# Patient Record
Sex: Male | Born: 1960 | Race: White | Hispanic: No | Marital: Married | Smoking: Current every day smoker
Health system: Southern US, Community
[De-identification: ages and names within clinical notes are randomized; demographics above are authoritative.]

## PROBLEM LIST (undated history)

## (undated) DIAGNOSIS — K7460 Unspecified cirrhosis of liver: Secondary | ICD-10-CM

## (undated) DIAGNOSIS — R251 Tremor, unspecified: Secondary | ICD-10-CM

## (undated) DIAGNOSIS — E119 Type 2 diabetes mellitus without complications: Secondary | ICD-10-CM

## (undated) DIAGNOSIS — M7551 Bursitis of right shoulder: Secondary | ICD-10-CM

## (undated) DIAGNOSIS — N179 Acute kidney failure, unspecified: Secondary | ICD-10-CM

## (undated) DIAGNOSIS — S069XAA Unspecified intracranial injury with loss of consciousness status unknown, initial encounter: Secondary | ICD-10-CM

## (undated) DIAGNOSIS — K7010 Alcoholic hepatitis without ascites: Secondary | ICD-10-CM

## (undated) DIAGNOSIS — S42001A Fracture of unspecified part of right clavicle, initial encounter for closed fracture: Secondary | ICD-10-CM

## (undated) DIAGNOSIS — L0390 Cellulitis, unspecified: Secondary | ICD-10-CM

## (undated) DIAGNOSIS — D696 Thrombocytopenia, unspecified: Secondary | ICD-10-CM

## (undated) DIAGNOSIS — E871 Hypo-osmolality and hyponatremia: Secondary | ICD-10-CM

## (undated) DIAGNOSIS — B1920 Unspecified viral hepatitis C without hepatic coma: Secondary | ICD-10-CM

## (undated) DIAGNOSIS — D638 Anemia in other chronic diseases classified elsewhere: Secondary | ICD-10-CM

## (undated) DIAGNOSIS — G9340 Encephalopathy, unspecified: Secondary | ICD-10-CM

## (undated) DIAGNOSIS — M5412 Radiculopathy, cervical region: Secondary | ICD-10-CM

## (undated) DIAGNOSIS — E876 Hypokalemia: Secondary | ICD-10-CM

## (undated) DIAGNOSIS — G40909 Epilepsy, unspecified, not intractable, without status epilepticus: Secondary | ICD-10-CM

## (undated) DIAGNOSIS — S069X9A Unspecified intracranial injury with loss of consciousness of unspecified duration, initial encounter: Secondary | ICD-10-CM

## (undated) HISTORY — PX: OTHER SURGICAL HISTORY: SHX169

## (undated) HISTORY — DX: Anemia in other chronic diseases classified elsewhere: D63.8

## (undated) HISTORY — DX: Acute kidney failure, unspecified: N17.9

---

## 2004-06-24 ENCOUNTER — Emergency Department (HOSPITAL_COMMUNITY): Admission: EM | Admit: 2004-06-24 | Discharge: 2004-06-24 | Payer: Self-pay | Admitting: Emergency Medicine

## 2005-09-16 ENCOUNTER — Emergency Department (HOSPITAL_COMMUNITY): Admission: EM | Admit: 2005-09-16 | Discharge: 2005-09-16 | Payer: Self-pay | Admitting: Emergency Medicine

## 2009-03-05 ENCOUNTER — Other Ambulatory Visit: Payer: Self-pay | Admitting: Internal Medicine

## 2009-07-01 ENCOUNTER — Emergency Department (HOSPITAL_COMMUNITY): Admission: EM | Admit: 2009-07-01 | Discharge: 2009-07-01 | Payer: Self-pay | Admitting: Emergency Medicine

## 2010-05-04 LAB — TYPE AND SCREEN: Antibody Screen: NEGATIVE

## 2010-05-04 LAB — COMPREHENSIVE METABOLIC PANEL
ALT: 34 U/L (ref 0–53)
AST: 92 U/L — ABNORMAL HIGH (ref 0–37)
CO2: 19 mEq/L (ref 19–32)
GFR calc Af Amer: >60 mL/min (ref >60)
Glucose, Bld: 96 mg/dL (ref 70–99)
Total Bilirubin: 1.7 mg/dL — ABNORMAL HIGH (ref 0.3–1.2)
Total Protein: 7 g/dL (ref 6.0–8.3)

## 2010-05-04 LAB — LIPASE, BLOOD: Lipase: 29 U/L (ref 11–59)

## 2010-05-04 LAB — PROTIME-INR: INR: 1.13 (ref 0.00–1.49)

## 2010-05-04 LAB — POCT I-STAT, CHEM 8
BUN: 4 mg/dL — ABNORMAL LOW (ref 6–23)
Chloride: 105 mEq/L (ref 96–112)
Glucose, Bld: 100 mg/dL — ABNORMAL HIGH (ref 70–99)
HCT: 44 % (ref 39.0–52.0)
Hemoglobin: 15 g/dL (ref 13.0–17.0)
TCO2: 20 mmol/L (ref 0–100)

## 2010-05-04 LAB — CBC
HCT: 42.6 % (ref 39.0–52.0)
Hemoglobin: 14.9 g/dL (ref 13.0–17.0)
MCV: 91.5 fL (ref 78.0–100.0)
Platelets: 89 10*3/uL — ABNORMAL LOW (ref 150–400)
RDW: 18.4 % — ABNORMAL HIGH (ref 11.5–15.5)

## 2010-05-04 LAB — RAPID URINE DRUG SCREEN, HOSP PERFORMED
Opiates: NOT DETECTED
Tetrahydrocannabinol: NOT DETECTED

## 2010-05-04 LAB — URINALYSIS, ROUTINE W REFLEX MICROSCOPIC
Bilirubin Urine: NEGATIVE
Glucose, UA: NEGATIVE mg/dL
Hgb urine dipstick: NEGATIVE
Ketones, ur: NEGATIVE mg/dL

## 2010-05-04 LAB — ABO/RH: ABO/RH(D): O NEG

## 2010-05-04 LAB — APTT: aPTT: 33 seconds (ref 24–37)

## 2010-11-26 IMAGING — CT CT CHEST W/ CM
1 series · 16 of 31 positions shown, 20 images · IV contrast (APPLIED)
Comparison: none

REASON FOR EXAM: hypoxia
COMMENTS:

PROCEDURE:     CT  - CT CHEST (FOR PE) W  - March 10, 2009  [DATE]
RESULT:     Chest CT dated 03/10/2009
TECHNIQUE: Helical 3 mm sections were obtained from the thoracic inlet
through the lung bases status post intravenous administration of 100 mL
Isovue 370.

[Series 4: soft tissue · axial · 0.74mm/px · z∈[-296,-38]mm · 16 of 94 slices shown, 20 images]
[im 4/94  mediastinal]
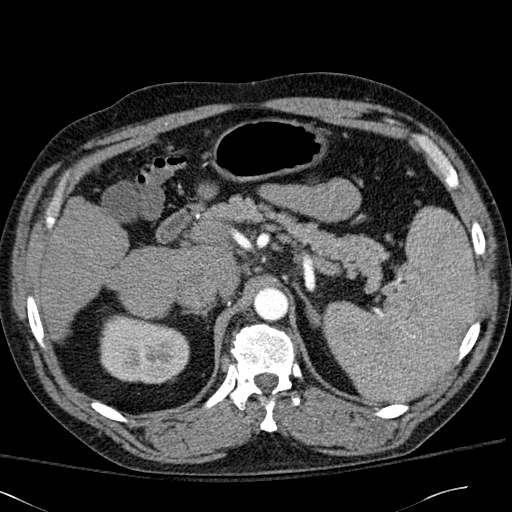
[im 4/94  lung]
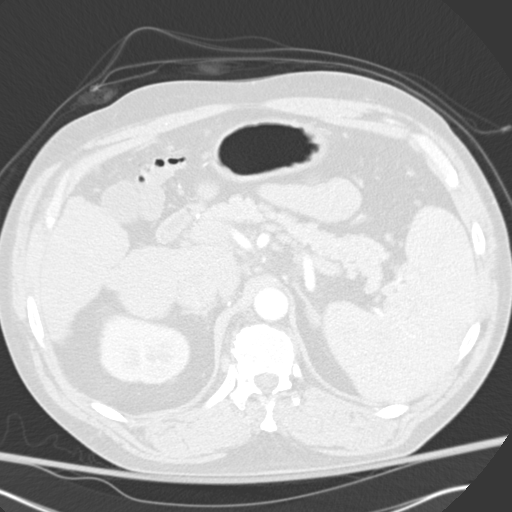
[im 11/94  lung]
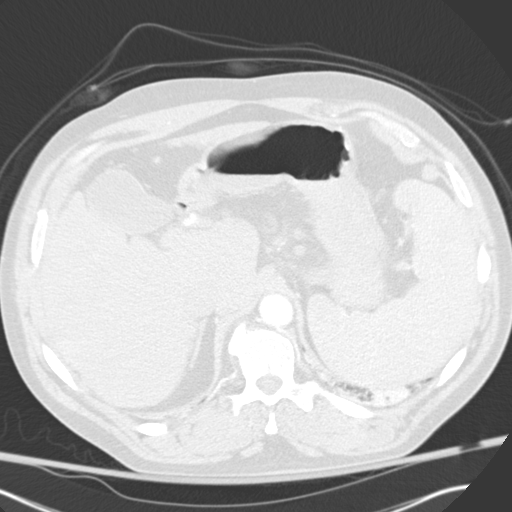
[im 18/94  lung]
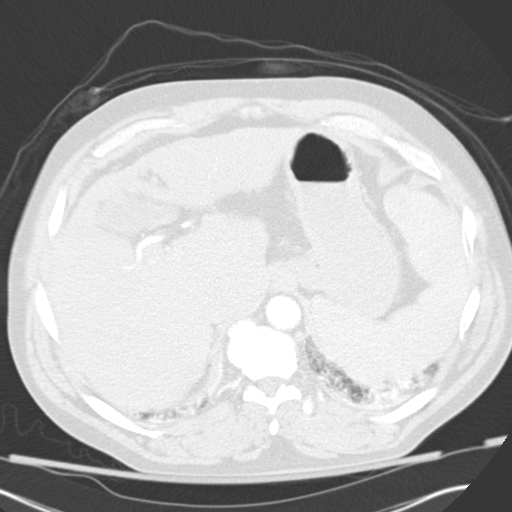
[im 21/94  lung]
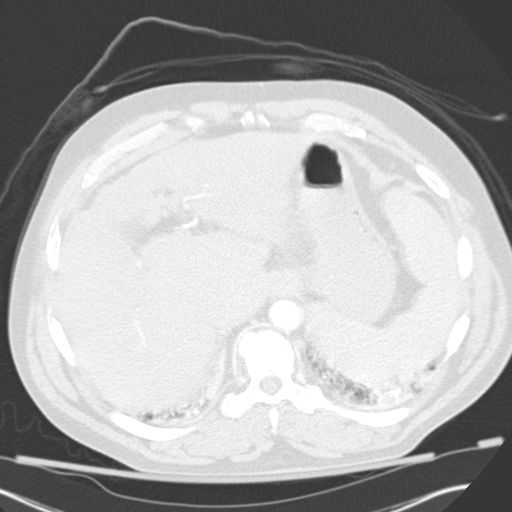
[im 28/94  mediastinal]
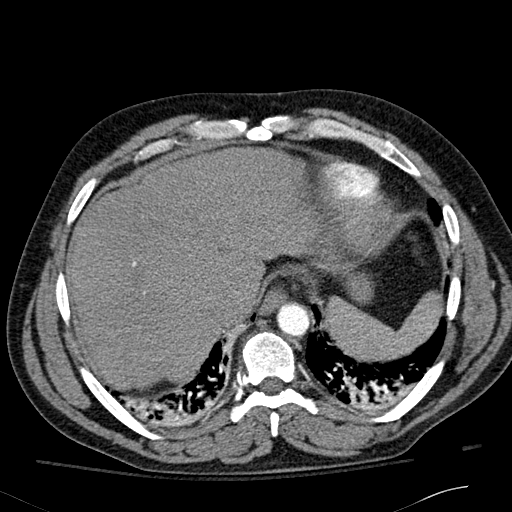
[im 28/94  lung]
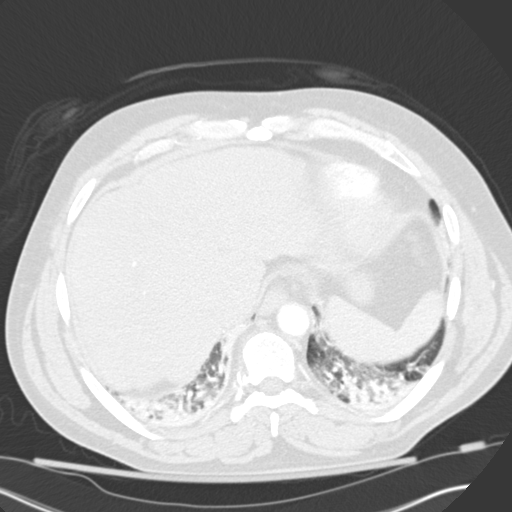
[im 32/94  lung]
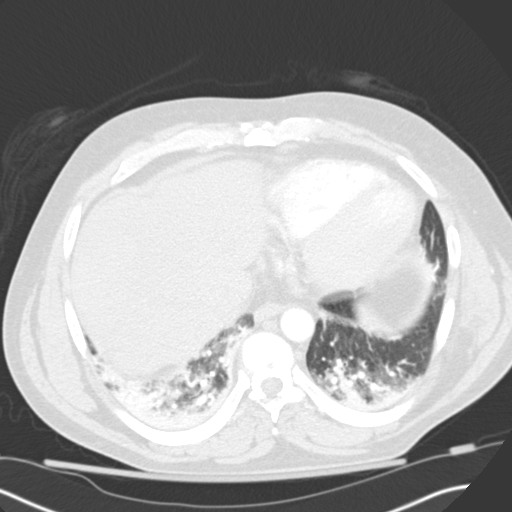
[im 38/94  lung]
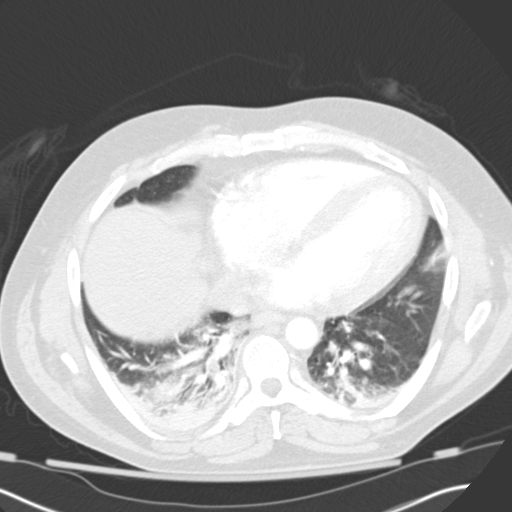
[im 45/94  lung]
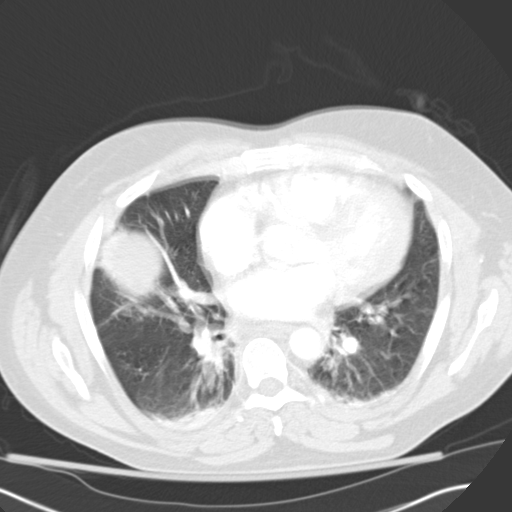
[im 50/94  mediastinal]
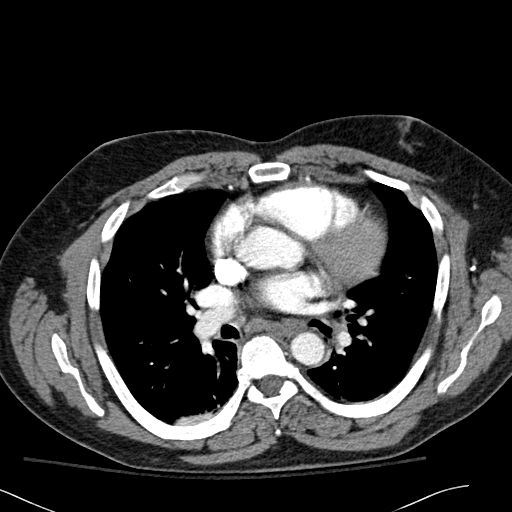
[im 50/94  lung]
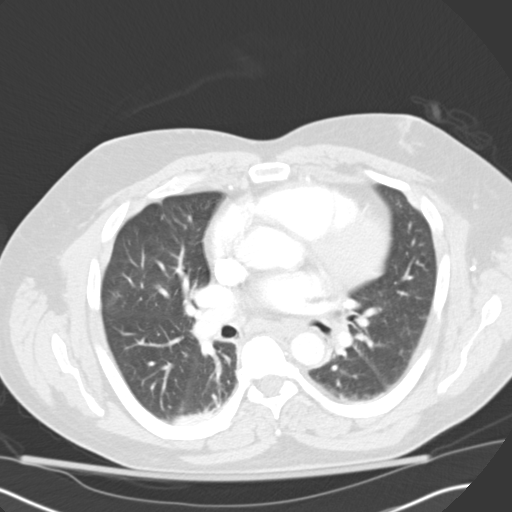
[im 56/94  lung]
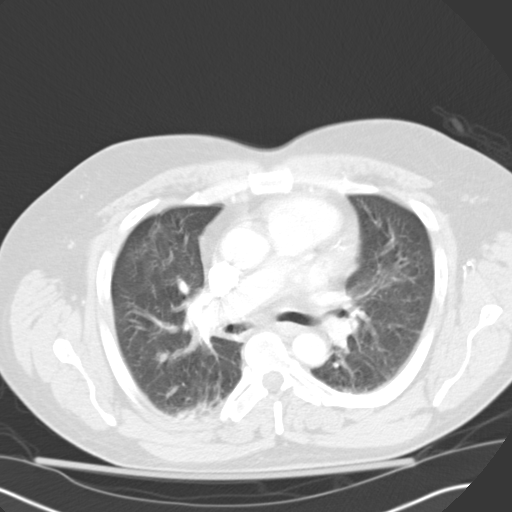
[im 63/94  lung]
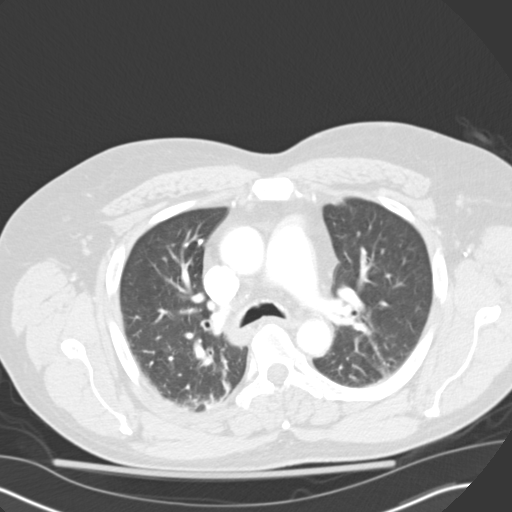
[im 66/94  lung]
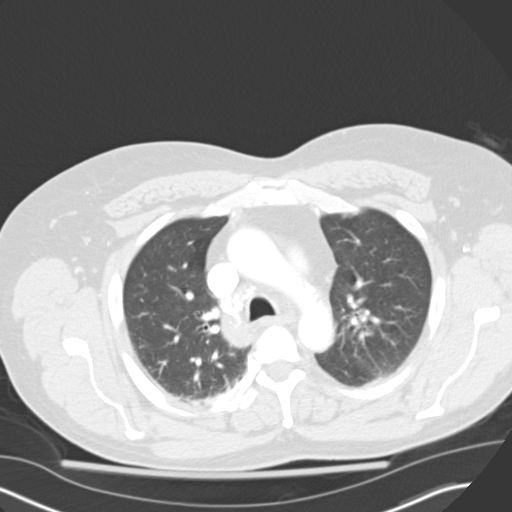
[im 73/94  mediastinal]
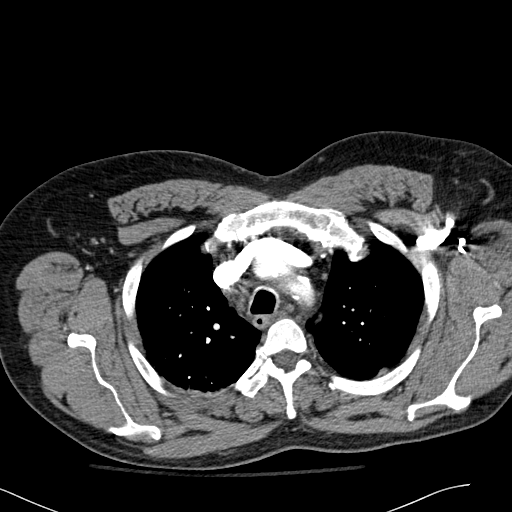
[im 73/94  lung]
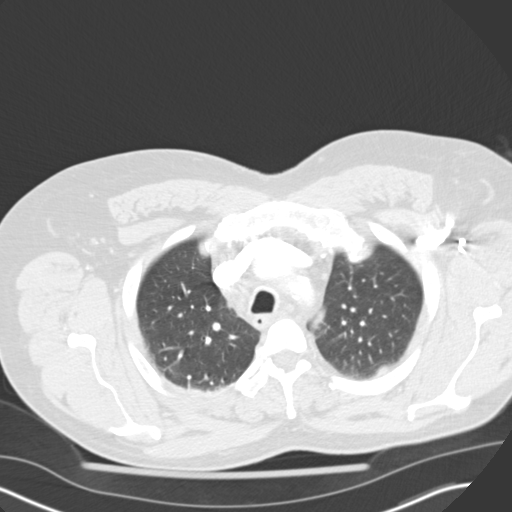
[im 76/94  lung]
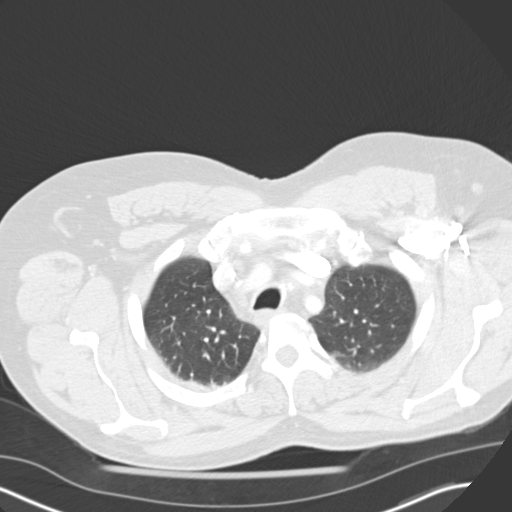
[im 83/94  lung]
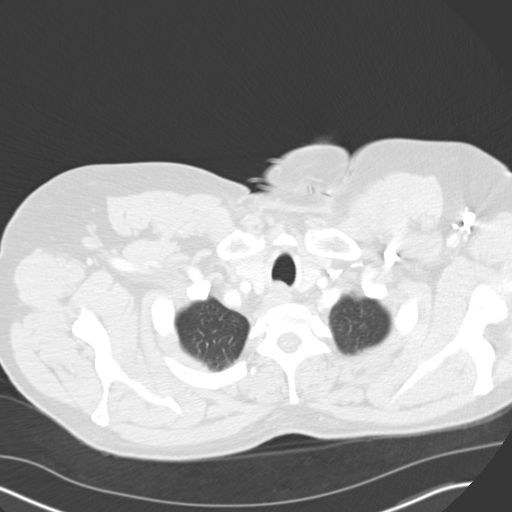
[im 90/94  lung]
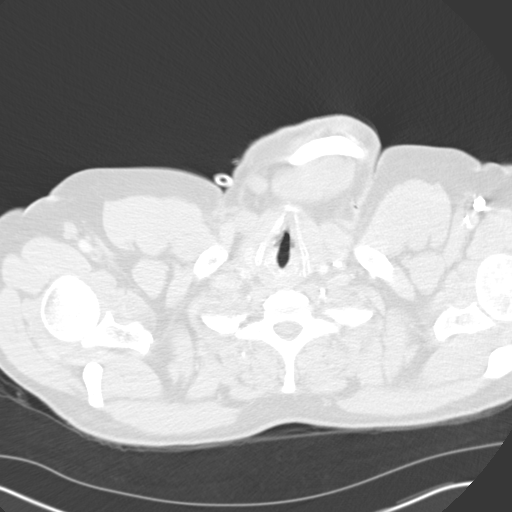

[16 of 31 positions shown; findings below may reference images not displayed]

FINDINGS: Evaluation of the mediastinum and hilar regions and structures demonstrates
no evidence of mediastinal nor hilar adenopathy nor masses. There is no
evidence of filling defects within the main lobar or segmental pulmonary
arteries. Areas of increased density project within the right left lung
bases. These findings have a multifocal appearance and may represent
atelectasis or bibasal infiltrates. There is no evidence of abdominal aortic
aneurysm nor dissection. The visualized upper abdominal viscera demonstrates
no gross abnormalities.
IMPRESSION: 1. No CT evidence of pulmonary arterial embolic disease
2. Atelectasis versus infiltrate within the lung bases surveillance
evaluation is recommended.

## 2012-08-12 ENCOUNTER — Telehealth: Payer: Self-pay | Admitting: Internal Medicine

## 2012-08-12 ENCOUNTER — Emergency Department (HOSPITAL_COMMUNITY)
Admission: EM | Admit: 2012-08-12 | Discharge: 2012-08-12 | Disposition: A | Discharge status: Home / Self Care | Payer: Medicaid Other | Attending: Emergency Medicine | Admitting: Emergency Medicine

## 2012-08-12 ENCOUNTER — Emergency Department (HOSPITAL_COMMUNITY): Payer: Medicaid Other

## 2012-08-12 ENCOUNTER — Encounter (HOSPITAL_COMMUNITY): Payer: Self-pay | Admitting: Vascular Surgery

## 2012-08-12 DIAGNOSIS — R51 Headache: Secondary | ICD-10-CM | POA: Insufficient documentation

## 2012-08-12 DIAGNOSIS — D696 Thrombocytopenia, unspecified: Secondary | ICD-10-CM | POA: Insufficient documentation

## 2012-08-12 DIAGNOSIS — Z8619 Personal history of other infectious and parasitic diseases: Secondary | ICD-10-CM | POA: Insufficient documentation

## 2012-08-12 DIAGNOSIS — Z8639 Personal history of other endocrine, nutritional and metabolic disease: Secondary | ICD-10-CM | POA: Insufficient documentation

## 2012-08-12 DIAGNOSIS — Z862 Personal history of diseases of the blood and blood-forming organs and certain disorders involving the immune mechanism: Secondary | ICD-10-CM | POA: Insufficient documentation

## 2012-08-12 DIAGNOSIS — R5381 Other malaise: Secondary | ICD-10-CM | POA: Insufficient documentation

## 2012-08-12 DIAGNOSIS — E119 Type 2 diabetes mellitus without complications: Secondary | ICD-10-CM | POA: Insufficient documentation

## 2012-08-12 DIAGNOSIS — R109 Unspecified abdominal pain: Secondary | ICD-10-CM | POA: Insufficient documentation

## 2012-08-12 DIAGNOSIS — E876 Hypokalemia: Secondary | ICD-10-CM | POA: Insufficient documentation

## 2012-08-12 DIAGNOSIS — G40909 Epilepsy, unspecified, not intractable, without status epilepticus: Secondary | ICD-10-CM | POA: Insufficient documentation

## 2012-08-12 DIAGNOSIS — Z8781 Personal history of (healed) traumatic fracture: Secondary | ICD-10-CM | POA: Insufficient documentation

## 2012-08-12 DIAGNOSIS — D72819 Decreased white blood cell count, unspecified: Secondary | ICD-10-CM | POA: Insufficient documentation

## 2012-08-12 DIAGNOSIS — Z8669 Personal history of other diseases of the nervous system and sense organs: Secondary | ICD-10-CM | POA: Insufficient documentation

## 2012-08-12 DIAGNOSIS — Z872 Personal history of diseases of the skin and subcutaneous tissue: Secondary | ICD-10-CM | POA: Insufficient documentation

## 2012-08-12 DIAGNOSIS — Z79899 Other long term (current) drug therapy: Secondary | ICD-10-CM | POA: Insufficient documentation

## 2012-08-12 DIAGNOSIS — IMO0001 Reserved for inherently not codable concepts without codable children: Secondary | ICD-10-CM | POA: Insufficient documentation

## 2012-08-12 HISTORY — DX: Thrombocytopenia, unspecified: D69.6

## 2012-08-12 HISTORY — DX: Hypokalemia: E87.6

## 2012-08-12 HISTORY — DX: Cellulitis, unspecified: L03.90

## 2012-08-12 HISTORY — DX: Unspecified intracranial injury with loss of consciousness of unspecified duration, initial encounter: S06.9X9A

## 2012-08-12 HISTORY — DX: Alcoholic hepatitis without ascites: K70.10

## 2012-08-12 HISTORY — DX: Type 2 diabetes mellitus without complications: E11.9

## 2012-08-12 HISTORY — DX: Tremor, unspecified: R25.1

## 2012-08-12 HISTORY — DX: Unspecified intracranial injury with loss of consciousness status unknown, initial encounter: S06.9XAA

## 2012-08-12 HISTORY — DX: Bursitis of right shoulder: M75.51

## 2012-08-12 HISTORY — DX: Unspecified viral hepatitis C without hepatic coma: B19.20

## 2012-08-12 HISTORY — DX: Encephalopathy, unspecified: G93.40

## 2012-08-12 HISTORY — DX: Radiculopathy, cervical region: M54.12

## 2012-08-12 HISTORY — DX: Epilepsy, unspecified, not intractable, without status epilepticus: G40.909

## 2012-08-12 HISTORY — DX: Fracture of unspecified part of right clavicle, initial encounter for closed fracture: S42.001A

## 2012-08-12 HISTORY — DX: Unspecified cirrhosis of liver: K74.60

## 2012-08-12 HISTORY — DX: Hypo-osmolality and hyponatremia: E87.1

## 2012-08-12 LAB — URINALYSIS, ROUTINE W REFLEX MICROSCOPIC
Glucose, UA: NEGATIVE mg/dL
Hgb urine dipstick: NEGATIVE
Protein, ur: NEGATIVE mg/dL
Specific Gravity, Urine: 1.006 (ref 1.005–1.030)
pH: 7 (ref 5.0–8.0)

## 2012-08-12 LAB — COMPREHENSIVE METABOLIC PANEL
AST: 120 U/L — ABNORMAL HIGH (ref 0–37)
Chloride: 107 mEq/L (ref 96–112)
GFR calc non Af Amer: >90 mL/min (ref >90)
Sodium: 142 mEq/L (ref 135–145)
Total Bilirubin: 4.3 mg/dL — ABNORMAL HIGH (ref 0.3–1.2)
Total Protein: 6.2 g/dL (ref 6.0–8.3)

## 2012-08-12 LAB — CBC WITH DIFFERENTIAL/PLATELET
Basophils Absolute: 0 10*3/uL (ref 0.0–0.1)
HCT: 43.1 % (ref 39.0–52.0)
Hemoglobin: 15.4 g/dL (ref 13.0–17.0)
Lymphocytes Relative: 33 % (ref 12–46)
Lymphs Abs: 0.9 10*3/uL (ref 0.7–4.0)
MCH: 35.4 pg — ABNORMAL HIGH (ref 26.0–34.0)
MCHC: 35.7 g/dL (ref 30.0–36.0)
Monocytes Relative: 11 % (ref 3–12)
Neutro Abs: 1.5 10*3/uL — ABNORMAL LOW (ref 1.7–7.7)
Neutrophils Relative %: 53 % (ref 43–77)
Platelets: 20 10*3/uL — CL (ref 150–400)
RDW: 15.1 % (ref 11.5–15.5)

## 2012-08-12 LAB — LIPASE, BLOOD: Lipase: 30 U/L (ref 11–59)

## 2012-08-12 LAB — PROTIME-INR: Prothrombin Time: 19.5 seconds — ABNORMAL HIGH (ref 11.6–15.2)

## 2012-08-12 MED ORDER — ONDANSETRON HCL 4 MG/2ML IJ SOLN
4.0000 mg | Freq: Once | INTRAMUSCULAR | Status: AC
  Administered 2012-08-12: 4 mg via INTRAVENOUS
  Filled 2012-08-12: qty 2

## 2012-08-12 NOTE — ED Provider Notes (Signed)
  Patient to the ED with a PMH of   Encephalopathy, unspecified [348.30] Tremor [781.0] Hyponatremia [276.1] Hypokalemia [276.8] TBI (traumatic brain injury) [854.00] Seizure disorder [345.90] Right clavicle fracture [810.00] Cellulitis [682.9] Alcoholic hepatitis [571.1] Hepatitis C [070.70] Bursitis of right shoulder [726.10] Hepatic cirrhosis [571.5] DM type 2 (diabetes mellitus, type 2) [250.00] Cervical radiculopathy [723.4] Thrombocytopenia [287.5]   Per the patients family member, his PCP told him that his platelets and hemoglobin are low and that he needed to go to the ER to admitted. He has problems with bleeding from his liver. The patient has been weaker than normal. He has nausea and vomiting and is tender to the touch. The family member says he always hurts, all the time, no matter where you touch. No acute distress at this time.  Labs: Urinalysis, CMP, lipase, CBC, pt/inr, saline lock.   MSE was initiated and I personally evaluated the patient and placed orders (if any) at  2:26 PM on August 12, 2012.  The patient appears stable so that the remainder of the MSE may be completed by another provider.   Dorthula Matas, PA-C 08/12/12 1430

## 2012-08-12 NOTE — ED Notes (Signed)
Pt and pt's mother comfortable with d/c and f/u instructions. No prescriptions 

## 2012-08-12 NOTE — ED Notes (Signed)
Pt reports to the ED for eval of N/V since this am. Pt was seen at the clinic yesterday and was informed that this Hb and platelets were low. Pt also reports weakness and malaise. Pt A&O x4. Pt has hx of TBI and has some residual word searching difficulties. V/S stable en route.

## 2012-08-12 NOTE — ED Notes (Signed)
Dr. Belfi at bedside 

## 2012-08-12 NOTE — ED Notes (Signed)
Pt's mother reports pt having episode of emesis. Pt resting comfortably upon entering room.

## 2012-08-12 NOTE — Telephone Encounter (Signed)
Called by Emergency room Physician Assistant Antony Madura regarding Mr. Cameron Gordon , a  Hep C+, liver cirrhosis patient who was instructed to report to the emergency room today due to reported lab abnormalities including low platlets.   PA Jobe Gibbon states patient denies actively bleeding including hematochezia, hematemesis, epistaxis or hematuria.  In the ER, he was found to have a positive stood guaic and a complete blood count of 2.8 WBC, 15.4 Hemoglobin and 20,000 plts ( last one about 3 years ago of 89 thousand.)  His CT of head was negative.  He follows with a primary care provider in [place], [place] reliably for management of his liver disease.  PLAN: 1) We recommended based on review of his lab results including a stable hemogloblin and absence of actively bleeding, that close follow up with his PCP early next week for a repeat complete blood count with transfusion if plt less than 10 thousand of the presence of active bleeding.  2) He should report to the nearest emergency room if he has any visible bleeding, i.e., bright blood per rectum or nose bleeds.   The patient was agreeable with the plan.  This plan was discussed with emergency room providers.

## 2012-08-12 NOTE — ED Provider Notes (Signed)
Medical screening examination/treatment/procedure(s) were performed by non-physician practitioner and as supervising physician I was immediately available for consultation/collaboration. Devoria Albe, MD, Armando Gang   Ward Givens, MD 08/12/12 [phone]

## 2012-08-12 NOTE — ED Notes (Signed)
Pt c/o increased nausea and dry heaving with small amount of clear emesis. Dr. Fredderick Phenix aware.

## 2012-08-12 NOTE — ED Notes (Signed)
Pt's mother contacted for transport

## 2012-08-12 NOTE — ED Provider Notes (Signed)
History    CSN: [id number] Arrival date & time 08/12/12  1404  First MD Initiated Contact with Patient 08/12/12 1502     Chief Complaint  Patient presents with  . Nausea  . Emesis  . Weakness   (Consider location/radiation/quality/duration/timing/severity/associated sxs/prior Treatment) HPI Comments: Patient is a 52 y/o male with extensive PMH and comorbidities who presents for abnormal blood results; patient went for physical at PCP office (Dr. Letta Pate) on Wednesday and had routine blood work drawn. Relative states got a call this AM from PCP and was told to bring patient to hospital for low Hgb and platelet levels. Upon further questioning, patient has complaint of his "head hurting" but is not able to give any more descriptors regarding this pain. Family member states that patient hurts all the time and was given some pain medicine by his PCP for this on Wednesday with mild relief. Family member does state that patient has been weaker than usual. She and/or patient deny any recent fevers, CP, SOB, diarrhea, urinary symptoms, and numbness/tingling.  Patient is a 52 y.o. male presenting with vomiting and weakness. The history is provided by the patient and a relative. No language interpreter was used.  Emesis Associated symptoms: abdominal pain, headaches and myalgias   Associated symptoms: no diarrhea   Weakness Associated symptoms include abdominal pain, headaches, myalgias, nausea, vomiting and weakness. Pertinent negatives include no chest pain, fever or numbness.   Past Medical History  Diagnosis Date  . Encephalopathy, unspecified   . Tremor   . Hyponatremia   . Hypokalemia   . TBI (traumatic brain injury)   . Seizure disorder   . Right clavicle fracture   . Cellulitis   . Alcoholic hepatitis   . Hepatitis C   . Bursitis of right shoulder   . Hepatic cirrhosis   . DM type 2 (diabetes mellitus, type 2)   . Cervical radiculopathy   . Thrombocytopenia    No past surgical  history on file. No family history on file. History  Substance Use Topics  . Smoking status: Not on file  . Smokeless tobacco: Not on file  . Alcohol Use: Not on file    Review of Systems  Constitutional: Negative for fever.  Respiratory: Negative for shortness of breath.   Cardiovascular: Negative for chest pain.  Gastrointestinal: Positive for nausea, vomiting and abdominal pain. Negative for diarrhea.  Musculoskeletal: Positive for myalgias.  Neurological: Positive for weakness and headaches. Negative for numbness.  All other systems reviewed and are negative.    Allergies  Review of patient's allergies indicates no known allergies.  Home Medications   Current Outpatient Rx  Name  Route  Sig  Dispense  Refill  . albuterol (PROVENTIL HFA;VENTOLIN HFA) 108 (90 BASE) MCG/ACT inhaler   Inhalation   Inhale 2 puffs into the lungs every 4 (four) hours as needed for wheezing.         . carvedilol (COREG) 3.125 MG tablet   Oral   Take 3.125 mg by mouth.         . citalopram (CELEXA) 20 MG tablet   Oral   Take 10-20 mg by mouth See admin instructions. Take 1/2 (10mg ) tablet once daily for 8 days then 1 daily thereafter         . diclofenac sodium (VOLTAREN) 1 % GEL   Topical   Apply 2 g topically 4 (four) times daily.         . ferrous sulfate 325 (65 FE) MG  tablet   Oral   Take 325 mg by mouth daily.         . furosemide (LASIX) 20 MG tablet   Oral   Take 20 mg by mouth every morning.         . Lacosamide (VIMPAT) 150 MG TABS   Oral   Take 150 mg by mouth 2 (two) times daily.         Marland Kitchen lactulose (CHRONULAC) 10 GM/15ML solution   Oral   Take 30 mLs by mouth 2 (two) times daily.         Marland Kitchen levETIRAcetam (KEPPRA) 500 MG tablet   Oral   Take 1,500 mg by mouth 2 (two) times daily.         . naproxen (NAPROSYN) 500 MG tablet   Oral   Take 500 mg by mouth 2 (two) times daily as needed (pain).         . pantoprazole (PROTONIX) 40 MG tablet    Oral   Take 40 mg by mouth daily.         . potassium chloride (K-DUR,KLOR-CON) 10 MEQ tablet   Oral   Take 10 mEq by mouth daily.         . ranitidine (ZANTAC) 300 MG tablet   Oral   Take 300 mg by mouth 2 (two) times daily as needed for heartburn.         . Thiamine HCl (VITAMIN B-1) 250 MG tablet   Oral   Take 250 mg by mouth daily.          BP 132/69  Pulse 61  Temp(Src) 98.8 F (37.1 C) (Oral)  Resp 18  SpO2 96%  Physical Exam  Nursing note and vitals reviewed. Constitutional: He is oriented to person, place, and time. He appears well-developed and well-nourished. No distress.  HENT:  Head: Normocephalic and atraumatic.  Mouth/Throat: Oropharynx is clear and moist. No oropharyngeal exudate.  Eyes: Conjunctivae and EOM are normal. Pupils are equal, round, and reactive to light. No scleral icterus.  Neck: Normal range of motion. Neck supple.  Cardiovascular: Normal rate, regular rhythm, normal heart sounds and intact distal pulses.   Pulmonary/Chest: Effort normal. No respiratory distress. He has wheezes (expiratory).  +adventitious sounds; no rales   Abdominal: Soft. He exhibits no mass. There is tenderness (diffuse, generalized). There is no rebound and no guarding.  No peritoneal signs  Genitourinary: Rectal exam shows external hemorrhoid. Rectal exam shows no internal hemorrhoid, no fissure, no tenderness and anal tone normal. Guaiac positive stool.  Brown stool per rectal exam; normal rectal tone.  Musculoskeletal: Normal range of motion.  Lymphadenopathy:    He has no cervical adenopathy.  Neurological: He is alert and oriented to person, place, and time.  Skin: Skin is warm and dry. No rash noted. He is not diaphoretic. No erythema. No pallor.  Psychiatric: He has a normal mood and affect. His behavior is normal.    ED Course  Procedures (including critical care time) Labs Reviewed  COMPREHENSIVE METABOLIC PANEL - Abnormal; Notable for the following:     BUN 3 (*)    Calcium 8.3 (*)    Albumin 2.2 (*)    AST 120 (*)    Alkaline Phosphatase 185 (*)    Total Bilirubin 4.3 (*)    All other components within normal limits  CBC WITH DIFFERENTIAL - Abnormal; Notable for the following:    WBC 2.8 (*)    MCH 35.4 (*)  Platelets 20 (*)    Neutro Abs 1.5 (*)    All other components within normal limits  PROTIME-INR - Abnormal; Notable for the following:    Prothrombin Time 19.5 (*)    INR 1.70 (*)    All other components within normal limits  OCCULT BLOOD, POC DEVICE - Abnormal; Notable for the following:    Fecal Occult Bld POSITIVE (*)    All other components within normal limits  URINALYSIS, ROUTINE W REFLEX MICROSCOPIC  LIPASE, BLOOD   Dg Chest 2 View  08/12/2012   *RADIOLOGY REPORT*  Clinical Data: Short of breath.  Smoking history.  CHEST - 2 VIEW  Comparison: CT 07/01/2009  Findings: Heart size is normal.  Mediastinal shadows are normal. Lungs are clear.  No effusions.  No acute bony finding.  Old clavicle fractures treated with ORIF.  IMPRESSION: No active cardiopulmonary disease.   Original Report Authenticated By: Paulina Fusi, M.D.   Ct Head Wo Contrast  08/12/2012   *RADIOLOGY REPORT*  Clinical Data: Nausea with vomiting and weakness.  History of traumatic brain injury.  CT HEAD WITHOUT CONTRAST  Technique:  Contiguous axial images were obtained from the base of the skull through the vertex without contrast.  Comparison: Head CT 07/01/2009.  Findings: There is no evidence of acute intracranial hemorrhage, mass lesion, brain edema or extra-axial fluid collection.  There is stable generalized atrophy with extensive left temporal lobe encephalomalacia.  No hydrocephalus or acute infarct is demonstrated.  Left temporal craniotomy appears stable.  The visualized paranasal sinuses, mastoid air cells and middle ears are clear.  There is stable medial displacement of the lamina papyracea on the left.  IMPRESSION: Stable post-traumatic and  postsurgical findings with left temporal lobe encephalomalacia.  No acute intracranial findings.   Original Report Authenticated By: Carey Bullocks, M.D.    1. Thrombocytopenia   2. Leukopenia     MDM  Patient presents for abnormal blood results; patient went for physical at PCP office (Dr. Letta Pate) on Wednesday and had routine blood work drawn. Was called by PCP today and told to come to ED for evaluation. Physical exam as above and mother reports this to be the patient's baseline. CBC, CMP, lipase, PT/INR, and UA ordered by Marlon Pel, PA-C who performed MSE on patient. Significant findings include platelet count of 20,000 and WBC count of 2.8. Total bilirubin also elevated to 4.3; however rest of labs appear to be c/w priors. Have spoken with Dr. Fredderick Phenix regarding patient and work up. Dr. Fredderick Phenix has also examined patient and advises CT head to r/o hemorrhage as cause of headache given low platelet count and POCT hemoccult.  CT head without acute intracranial findings including hemorrhage, mass/lesion or hydrocephalus. Hemoccult positive. Have discussed further w/u with abdominal imaging with Dr. Fredderick Phenix. Given chronicity of pain and patient being at baseline, have agreed that further work up will not change overall tx and management of patient. Given low platelet count and positive hemoccult, however, will consult IM for obs admission.  Dr. Fredderick Phenix consulted with Dr. Julian Reil who does not believe patient meets criteria for admission given chronic comorbidities, the fact that patient has no evidence of active bleeding, and the fact that patient does not meet criteria for platelet transfusion. Reviewed case with Dr. Rosie Fate from Hematology/Oncology who believes patient is stable for d/c with PCP follow up and repeat CBC on Monday with strict return precautions to ED should he develop any signs of active bleeding. Patient advised to quit drinking Etoh. Patient  work up and management again discussed with Dr.  Fredderick Phenix, who is in agreement with this work up and management plan.    Antony Madura, PA-C 08/17/12 1905

## 2012-08-18 NOTE — ED Provider Notes (Signed)
Medical screening examination/treatment/procedure(s) were conducted as a shared visit with non-physician practitioner(s) and myself.  I personally evaluated the patient during the encounter   Rolan Bucco, MD 08/18/12 [phone]

## 2012-08-26 ENCOUNTER — Encounter: Payer: Self-pay | Admitting: Nurse Practitioner

## 2012-08-30 ENCOUNTER — Ambulatory Visit: Payer: Self-pay | Admitting: Nurse Practitioner

## 2012-09-11 ENCOUNTER — Telehealth: Payer: Self-pay | Admitting: Neurology

## 2012-10-31 ENCOUNTER — Ambulatory Visit: Payer: Self-pay | Admitting: Nurse Practitioner

## 2012-11-15 ENCOUNTER — Ambulatory Visit (INDEPENDENT_AMBULATORY_CARE_PROVIDER_SITE_OTHER): Payer: Medicaid Other | Admitting: Nurse Practitioner

## 2012-11-15 ENCOUNTER — Encounter: Payer: Self-pay | Admitting: Nurse Practitioner

## 2012-11-15 VITALS — BP 120/71 | HR 68 | Ht 69.0 in | Wt 234.0 lb

## 2012-11-15 DIAGNOSIS — G40219 Localization-related (focal) (partial) symptomatic epilepsy and epileptic syndromes with complex partial seizures, intractable, without status epilepticus: Secondary | ICD-10-CM | POA: Insufficient documentation

## 2012-11-15 MED ORDER — LACOSAMIDE 100 MG PO TABS: 100.0000 mg | ORAL_TABLET | Freq: Two times a day (BID) | ORAL | Status: AC

## 2012-11-15 MED ORDER — LEVETIRACETAM 500 MG PO TABS: 500.0000 mg | ORAL_TABLET | Freq: Two times a day (BID) | ORAL | Status: AC

## 2012-11-15 NOTE — Patient Instructions (Addendum)
Continue Keppra 500 twice daily Continued Vimpat 100 twice daily Call for any seizure activity Followup 6-8 months

## 2012-11-15 NOTE — Progress Notes (Signed)
[facility]  PATIENT: Cameron Gordon DOB: February 08, 1961   REASON FOR VISIT: Seizure disorder   HISTORY OF PRESENT ILLNESS: 52 year old right-handed white male with a history of hepatitis C, end-stage liver disease, history of head trauma and a subsequent seizure disorder. He was last seen in this office 03/01/2012. Since that time he has had 2 admissions to the hospital the most recent to [facility], with diagnosis of acute renal failure, his Vimpat and Keppra doses were reduced. Last seizure was December 2013.   03/01/12: Patient returns for followup. He was seen by Dr. Anne Hahn 07/14/11. He had an admission to [facility] from 10/30-2013-02/07/12. 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 Patient now has a feeding tube and gets all meds through this. He is on tube feedings. He is getting nothing by mouth. He continues with Vimpat and Keppra. Gabapentin was discontinued during hospitilization.He denies further seizure events since discharge.    HISTORY: Right-handed white male with a history of hepatitis C, end-stage liver disease, history of head trauma and a subsequent seizure disorder. The patient continues to have seizures on a relatively frequent basis, most associated with a staring episode. The patient went to the emergency room in February 2013 with a seizure. The patient continues to drink alcohol, the total amount is unknown. Patient sleeps a lot during the day. The patient remains on gabapentin, Keppra and Vimpat. The patient returns for an evaluation. The patient does not operate a motor vehicle.   REVIEW OF SYSTEMS: Full 14 system review of systems performed and notable only for:  Constitutional: N/A  Cardiovascular: N/A  Ear/Nose/Throat: Hearing loss  Skin: Cellulitis Eyes: N/A  Respiratory: Shortness of breath, cough, wheezing  Gastroitestinal: N/A  Hematology/Lymphatic: Easy bruising  Endocrine: N/A Musculoskeletal:N/A  Allergy/Immunology: N/A    Neurological: N/A Psychiatric: Depression  ALLERGIES: No Known Allergies  HOME MEDICATIONS: Outpatient Prescriptions Prior to Visit  Medication Sig Dispense Refill  . albuterol (PROVENTIL HFA;VENTOLIN HFA) 108 (90 BASE) MCG/ACT inhaler Inhale 2 puffs into the lungs every 4 (four) hours as needed for wheezing.      . carvedilol (COREG) 3.125 MG tablet Take 3.125 mg by mouth.      . citalopram (CELEXA) 20 MG tablet Take 10-20 mg by mouth See admin instructions. Take 1/2 (10mg ) tablet once daily for 8 days then 1 daily thereafter      . diclofenac sodium (VOLTAREN) 1 % GEL Apply 2 g topically 4 (four) times daily.      . ferrous sulfate 325 (65 FE) MG tablet Take 325 mg by mouth daily.      . furosemide (LASIX) 20 MG tablet Take 20 mg by mouth every morning.      . Lacosamide (VIMPAT) 150 MG TABS Take 150 mg by mouth 2 (two) times daily.      Marland Kitchen lactulose (CHRONULAC) 10 GM/15ML solution Take 30 mLs by mouth 2 (two) times daily.      Marland Kitchen levETIRAcetam (KEPPRA) 500 MG tablet Take 1,500 mg by mouth 2 (two) times daily.      . naproxen (NAPROSYN) 500 MG tablet Take 500 mg by mouth 2 (two) times daily as needed (pain).      . pantoprazole (PROTONIX) 40 MG tablet Take 40 mg by mouth daily.      . potassium chloride (K-DUR,KLOR-CON) 10 MEQ tablet Take 10 mEq by mouth daily.      . ranitidine (ZANTAC) 300 MG tablet Take 300 mg by mouth 2 (two) times daily  as needed for heartburn.      . Thiamine HCl (VITAMIN B-1) 250 MG tablet Take 250 mg by mouth daily.       No facility-administered medications prior to visit.    PAST MEDICAL HISTORY: Past Medical History  Diagnosis Date  . Encephalopathy, unspecified   . Tremor   . Hyponatremia   . Hypokalemia   . TBI (traumatic brain injury)   . Seizure disorder   . Right clavicle fracture   . Cellulitis   . Alcoholic hepatitis   . Hepatitis C   . Bursitis of right shoulder   . Hepatic cirrhosis   . DM type 2 (diabetes mellitus, type 2)   .  Cervical radiculopathy   . Thrombocytopenia     PAST SURGICAL HISTORY: Past Surgical History  Procedure Laterality Date  . G tube surgery      FAMILY HISTORY: History reviewed. No pertinent family history.  SOCIAL HISTORY: History   Social History  . Marital Status: Married    Spouse Name: N/A    Number of Children: 0  . Years of Education: N/A   Occupational History  . Not on file.   Social History Main Topics  . Smoking status: Current Every Day Smoker -- 1.00 packs/day    Types: Cigarettes  . Smokeless tobacco: Never Used  . Alcohol Use: No  . Drug Use: No  . Sexual Activity: Not on file   Other Topics Concern  . Not on file   Social History Narrative   The patient lives with his wife.    Patient is on disability.    Patient has no children.      PHYSICAL EXAM  Filed Vitals:   11/15/12 1413  BP: 120/71  Pulse: 68  Height: 5\' 9"  (1.753 m)  Weight: 234 lb (106.142 kg)   Body mass index is 34.54 kg/(m^2).  Generalized: Well developed, obese male in no acute distress  Neurological examination   Mentation: Alert oriented to time, place, history taking. Follows all commands speech and language fluent. Flat affect  Cranial nerve II-XII: .Pupils were equal round reactive to light extraocular movements were full, visual field were full on confrontational test. Facial sensation and strength were normal. hearing was intact to finger rubbing bilaterally. Uvula tongue midline. head turning and shoulder shrug and were normal and symmetric.Tongue protrusion into cheek strength was normal. Motor: normal bulk and tone, full strength in the BUE, BLE, fine finger movements normal, no pronator drift. No focal weakness Coordination: finger-nose-finger, heel-to-shin bilaterally, no dysmetria Reflexes: Depressed and symmetric  Gait and Station: Rising up from seated position without assistance, unsteady gait, ambulating with her walker, tandem gait not attempted     DIAGNOSTIC DATA (LABS, IMAGING, TESTING) - I reviewed patient records, labs, notes, testing and imaging myself where available.  Lab Results  Component Value Date   WBC 2.8* 08/12/2012   HGB 15.4 08/12/2012   HCT 43.1 08/12/2012   MCV 99.1 08/12/2012   PLT 20* 08/12/2012      Component Value Date/Time   NA 142 08/12/2012 1506   K 3.7 08/12/2012 1506   CL 107 08/12/2012 1506   CO2 28 08/12/2012 1506   GLUCOSE 92 08/12/2012 1506   BUN 3* 08/12/2012 1506   CREATININE 0.73 08/12/2012 1506   CALCIUM 8.3* 08/12/2012 1506   PROT 6.2 08/12/2012 1506   ALBUMIN 2.2* 08/12/2012 1506   AST 120* 08/12/2012 1506   ALT 43 08/12/2012 1506   ALKPHOS 185* 08/12/2012 1506  BILITOT 4.3* 08/12/2012 1506   GFRNONAA >90 08/12/2012 1506   GFRAA >90 08/12/2012 1506          ASSESSMENT AND PLAN  52 y.o. year old male  here for followup for seizure disorder, history of head trauma , history of  Encephalopathy,  Alcoholic hepatitis; Hepatitis C; Hepatic cirrhosis; DM type 2 (diabetes mellitus, type 2);  and Thrombocytopenia. here for followup. He remains on Keppra and Vimpat.. Last seizure December 2013  Continue Keppra 500 twice daily Continued Vimpat 100 twice daily Call for any seizure activity Followup 6-8 months Nilda Riggs, [facility], [facility], APRN  [facility] [address] [place], [place] [address] [phone]

## 2012-11-15 NOTE — Progress Notes (Signed)
I have read the note, and I agree with the clinical assessment and plan.  Cameron Gordon   

## 2012-11-22 ENCOUNTER — Other Ambulatory Visit: Payer: Self-pay | Admitting: Emergency Medicine

## 2013-03-15 ENCOUNTER — Other Ambulatory Visit: Payer: Self-pay

## 2013-03-16 ENCOUNTER — Other Ambulatory Visit: Payer: Self-pay | Admitting: Internal Medicine

## 2013-03-16 DIAGNOSIS — A419 Sepsis, unspecified organism: Secondary | ICD-10-CM

## 2013-03-29 ENCOUNTER — Other Ambulatory Visit: Payer: Self-pay | Admitting: Internal Medicine

## 2013-04-15 DEATH — deceased

## 2013-07-16 ENCOUNTER — Ambulatory Visit: Payer: Medicaid Other | Admitting: Nurse Practitioner

## 2014-07-03 IMAGING — US US RENAL KIDNEY
1 series · 14 of 25 positions shown · non-contrast
Comparison: none

REASON FOR EXAM: acute renal failure
COMMENTS:

[Series 1: us renal kidney · 0.33mm/px · 14 of 43 slices shown]
[im 1/43]
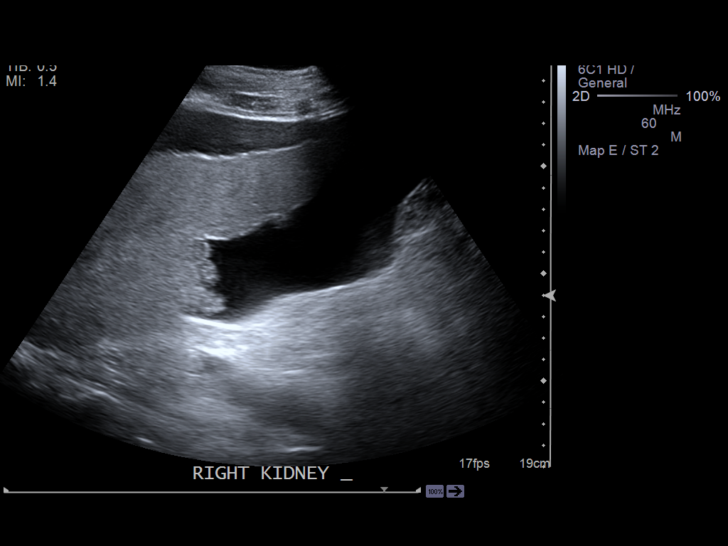
[im 4/43]
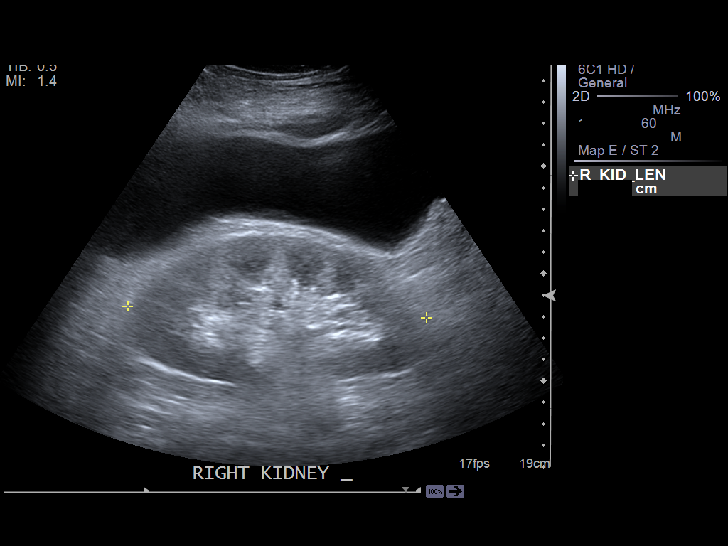
[im 8/43]
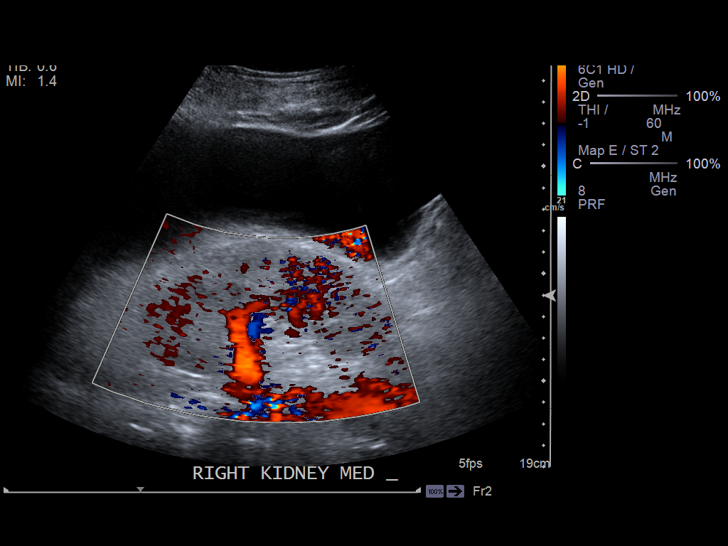
[im 11/43]
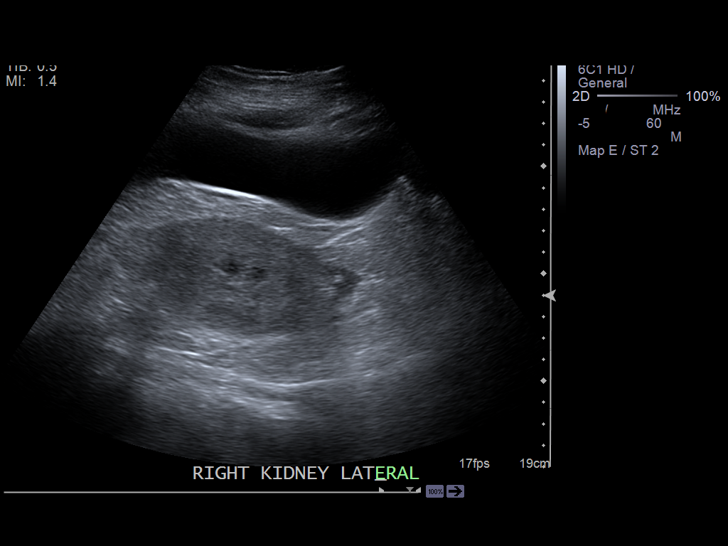
[im 15/43]
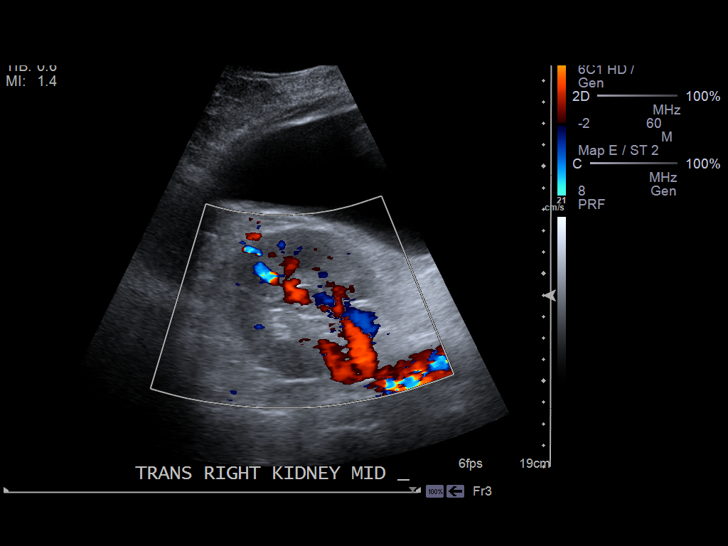
[im 16/43]
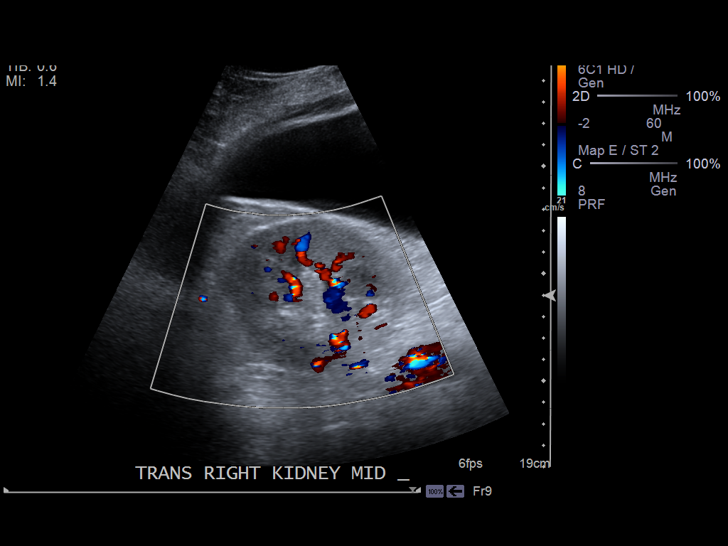
[im 20/43]
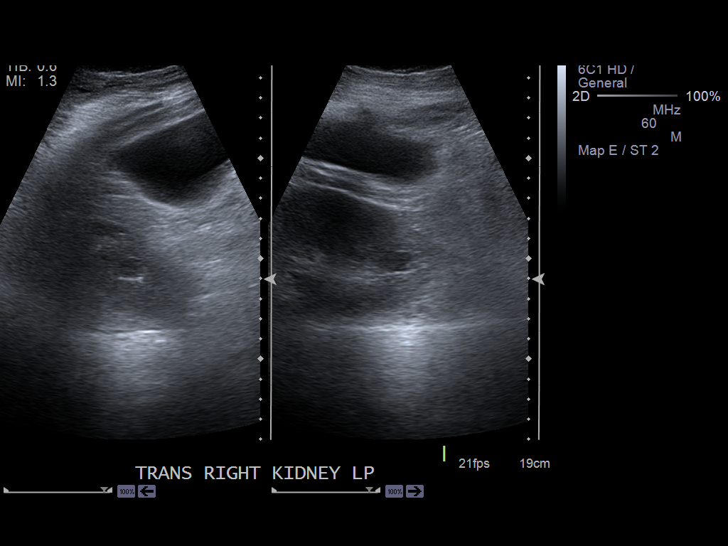
[im 23/43]
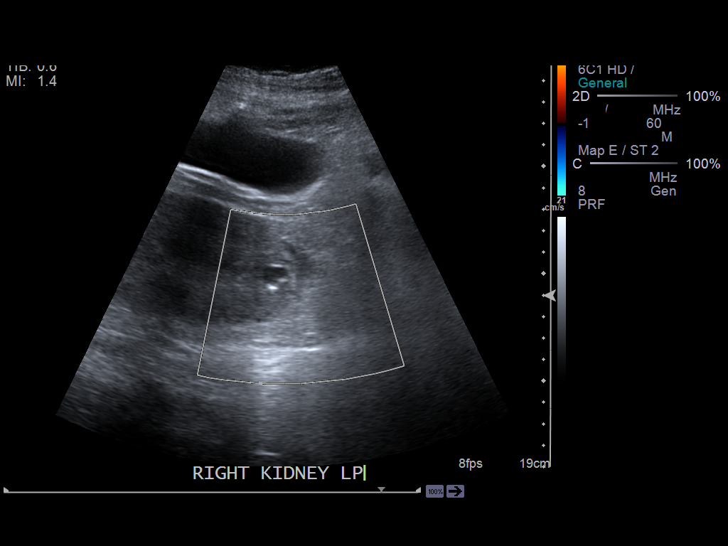
[im 27/43]
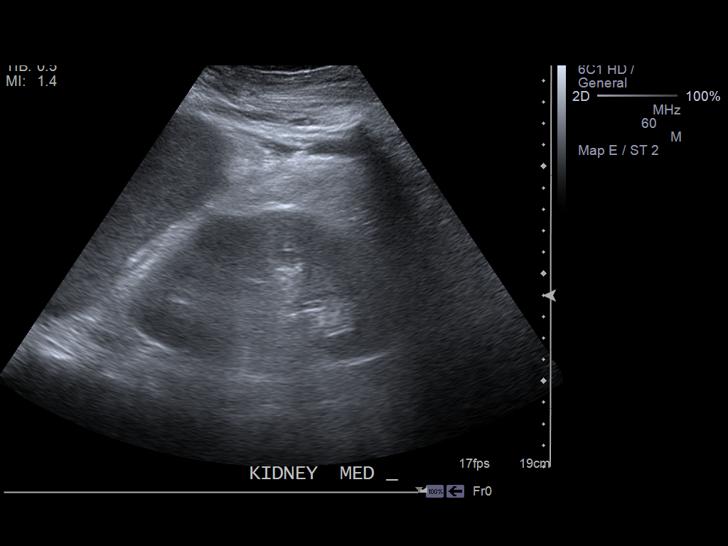
[im 29/43]
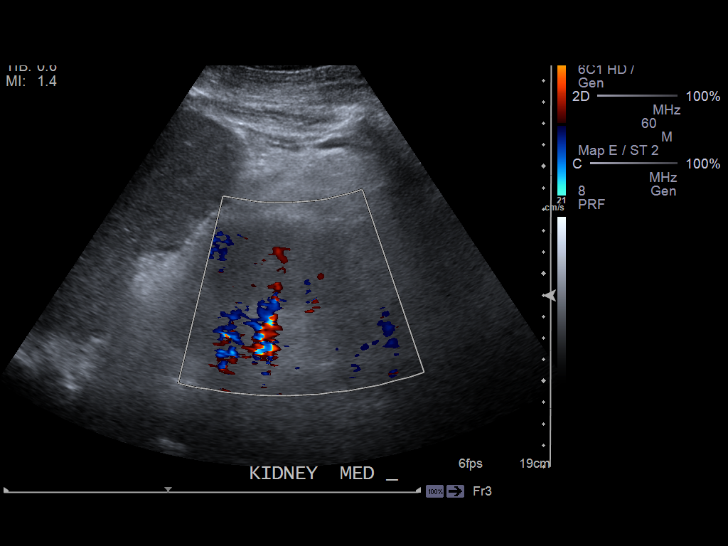
[im 32/43]
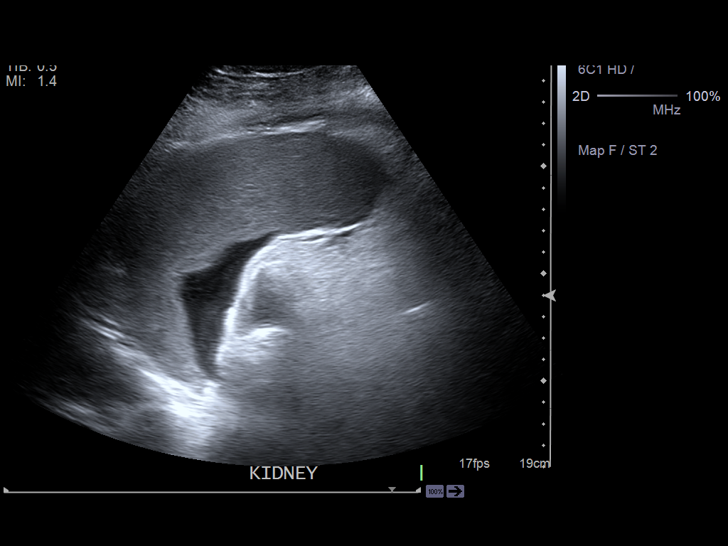
[im 36/43]
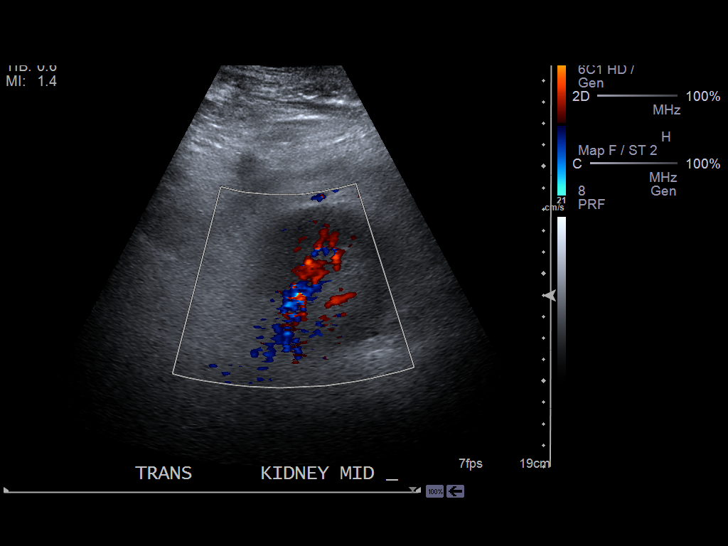
[im 39/43]
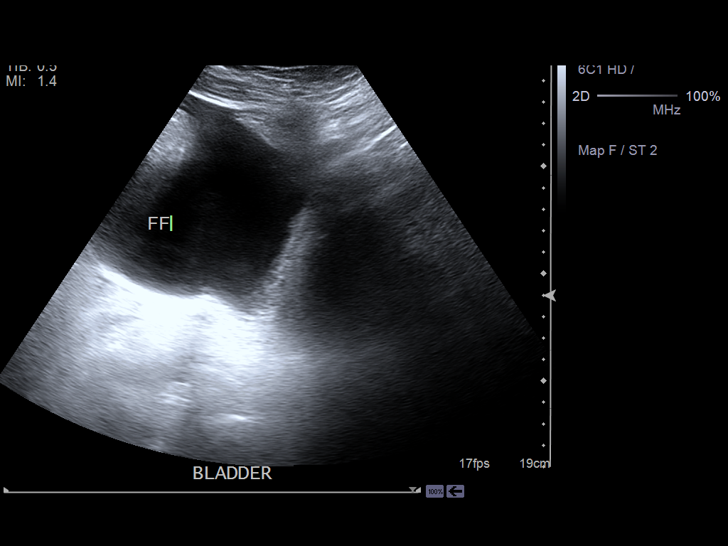
[im 43/43]
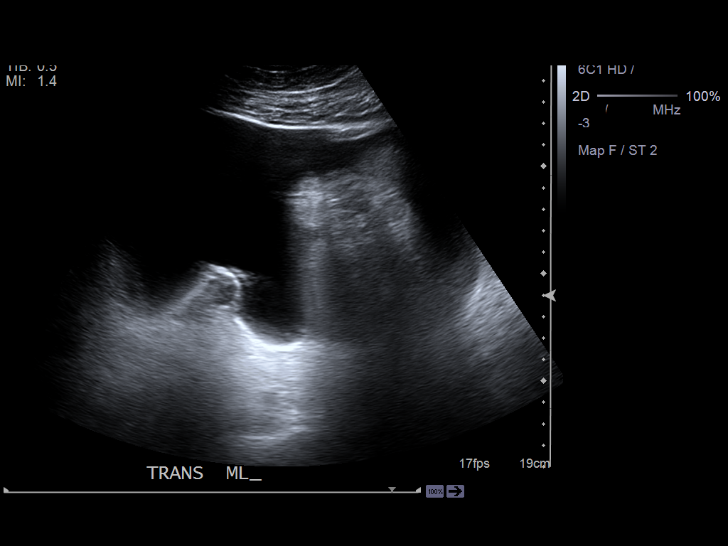

[14 of 25 positions shown; findings below may reference images not displayed]

PROCEDURE:     US  - US KIDNEY  - October 15, 2012  [DATE]

RESULT:     There is a large amount of ascites present. The right kidney
measures 13.9 x 5.6 x 6.4 cm. The left kidney measures 13.5 x 6.3 x 6.7 cm.
The echotexture of the renal cortex is increased diffusely. There is a lower
pole cyst on the right measuring 1.7 cm in greatest dimension. Neither
kidney exhibits evidence of hydronephrosis. The urinary bladder could not be
demonstrated.
IMPRESSION: 1. Increased echotexture of the renal cortex bilaterally is consistent with
medical renal disease.
2. There is no evidence of obstruction.

[REDACTED]

## 2014-12-05 IMAGING — CR DG ABDOMEN 1V
1 series · 2 of 2 positions shown · non-contrast
Comparison: 03/17/2013

CLINICAL DATA: Ascites.  Increase gastric residuals.

EXAM:
ABDOMEN - 1 VIEW

[Series 1: supine kub · 0.17mm/px · 2 of 2 slices shown]
[im 1/2]
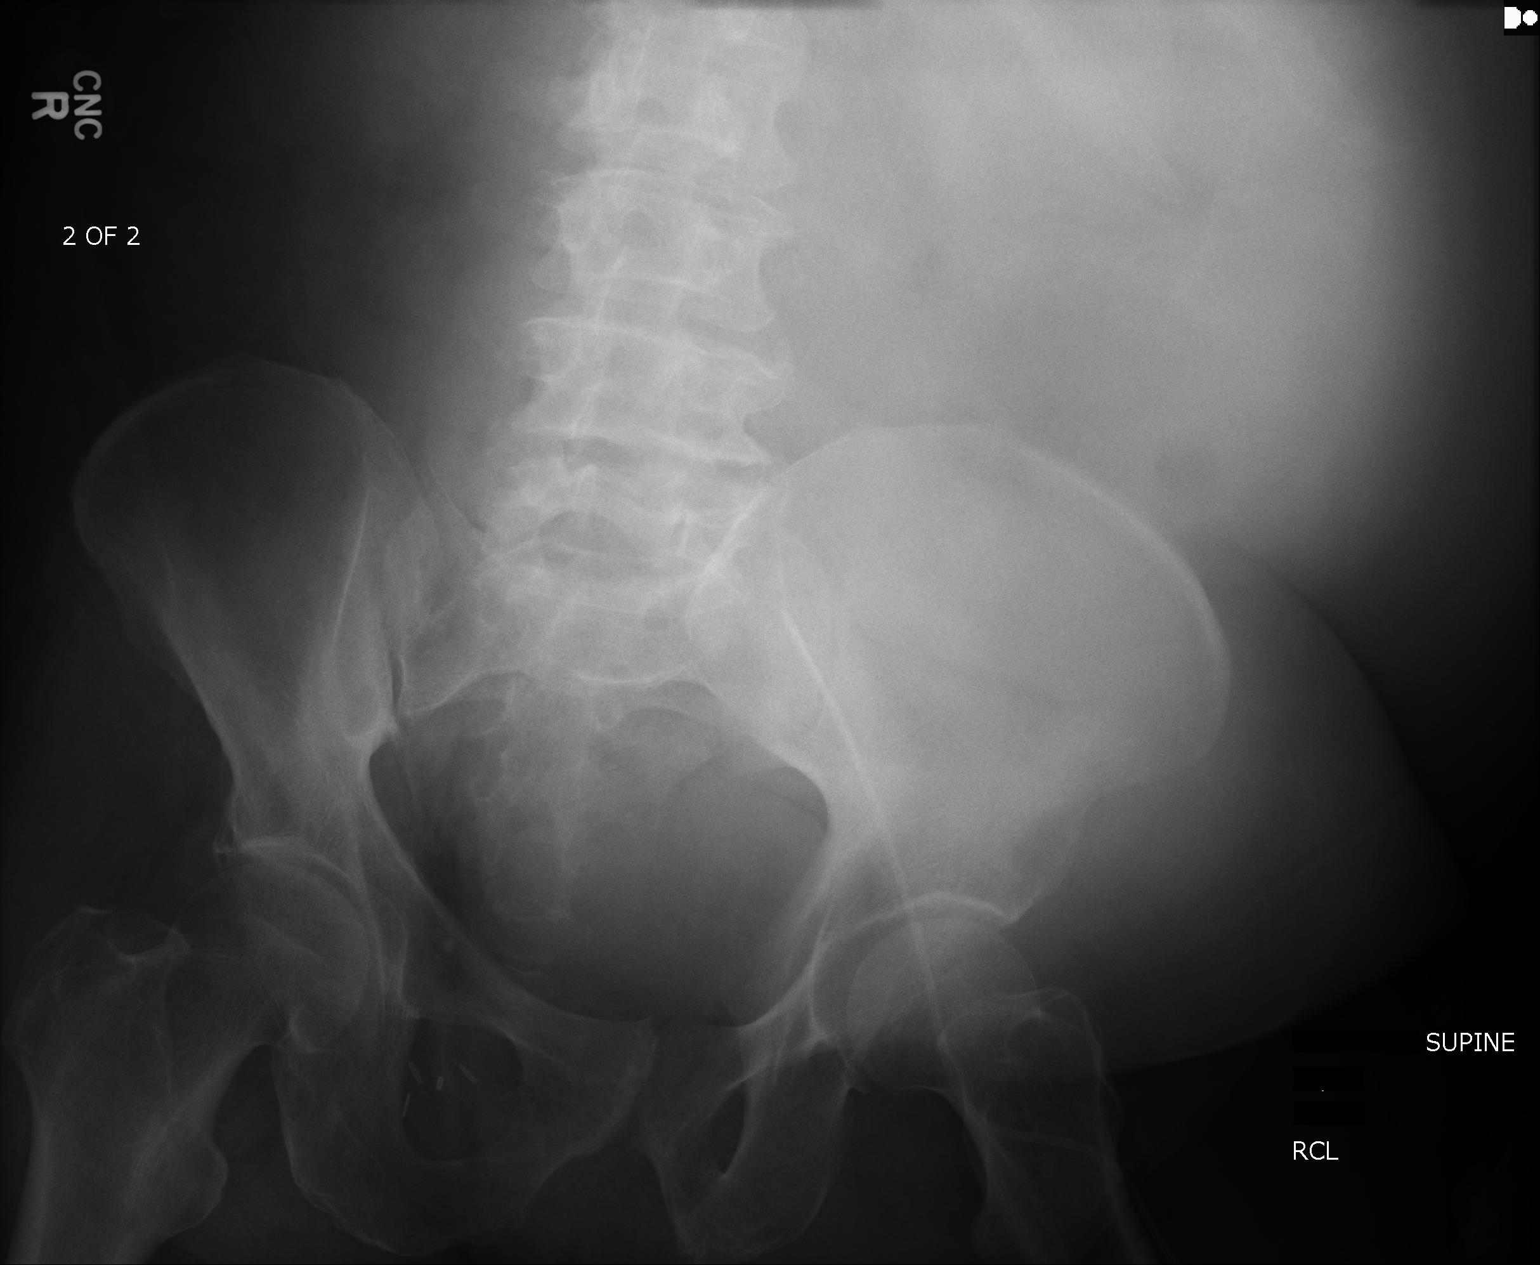
[im 2/2]
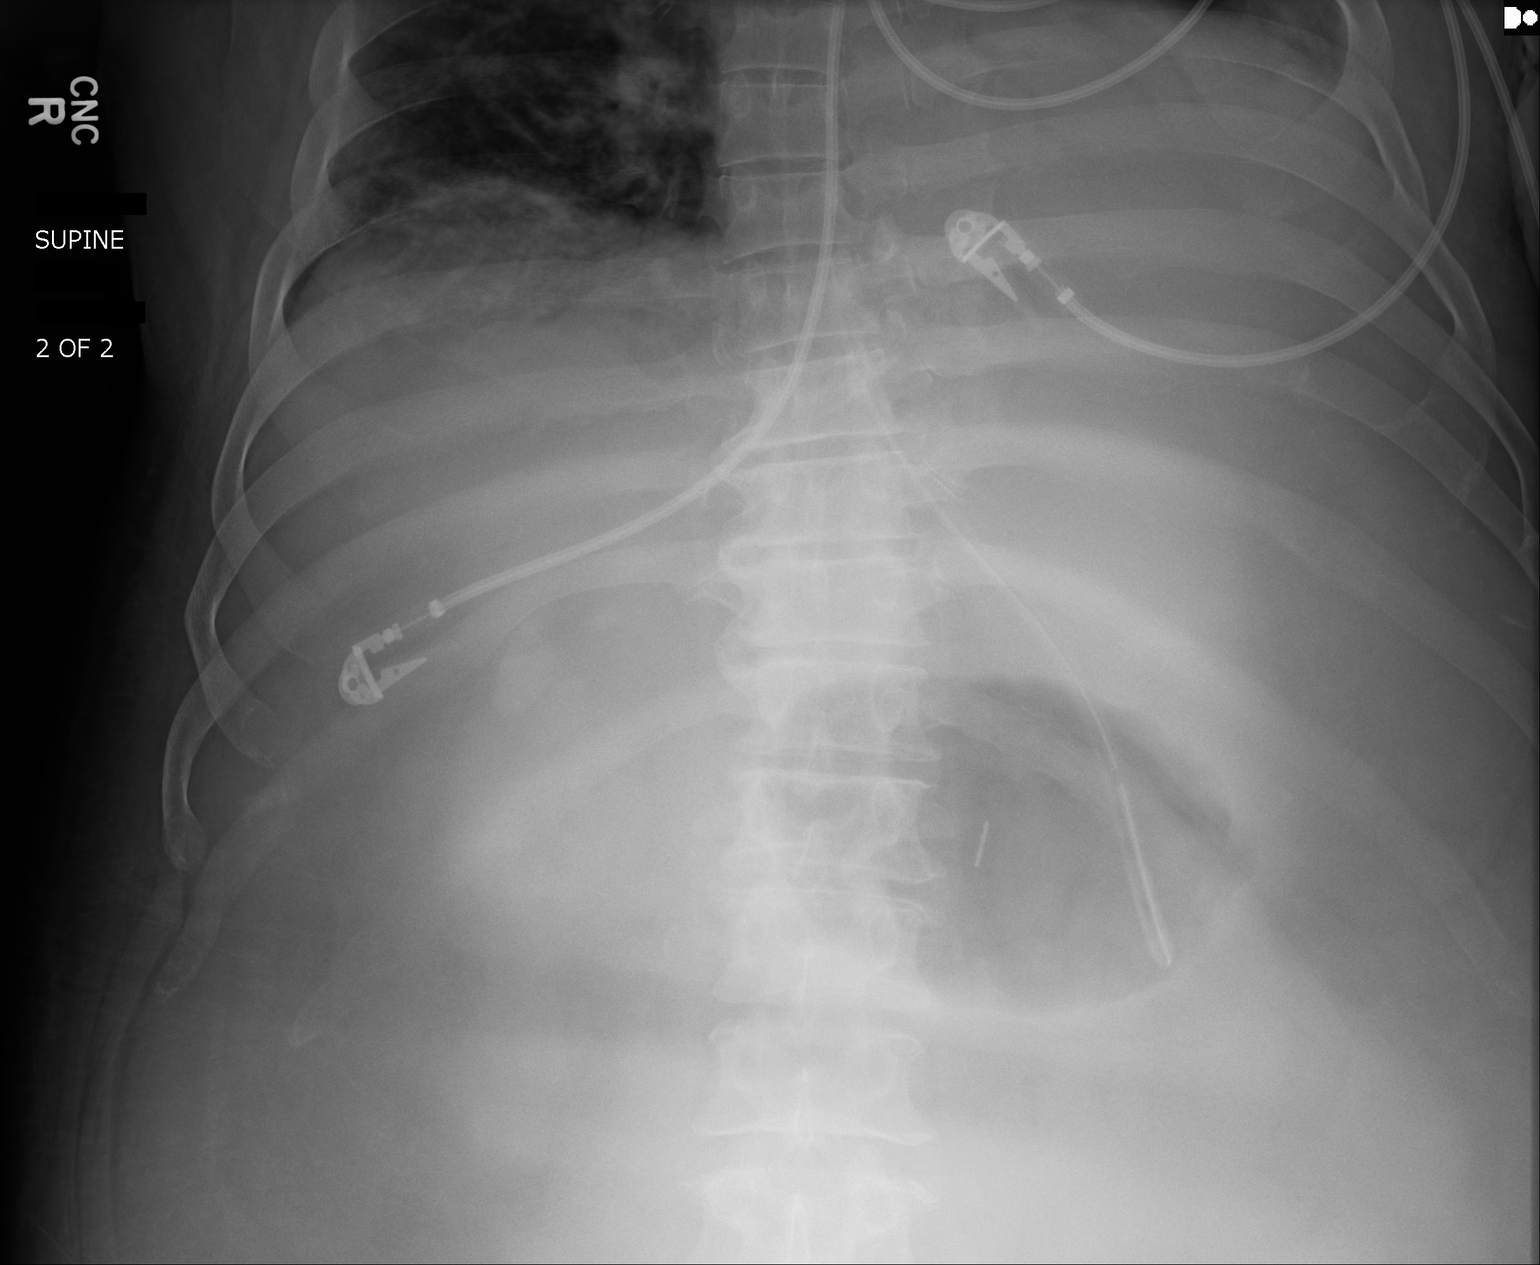

[2 of 2 positions shown; findings below may reference images not displayed]

FINDINGS: The nasogastric tube remains in place with tip in the mid stomach.
Left femoral venous catheter also remains in place. Nearly gasless
abdomen noted. Opacity again seen in both lung bases, left side
greater than right.
IMPRESSION: Nearly gasless abdomen. Nasogastric tube tip remains within the mid
stomach.

## 2014-12-11 IMAGING — CR DG CHEST 1V PORT
1 series · 1 of 1 positions shown · non-contrast
Comparison: 03/24/2013

CLINICAL DATA: Evaluate new right infiltrate.

EXAM:
PORTABLE CHEST - 1 VIEW

[ap]
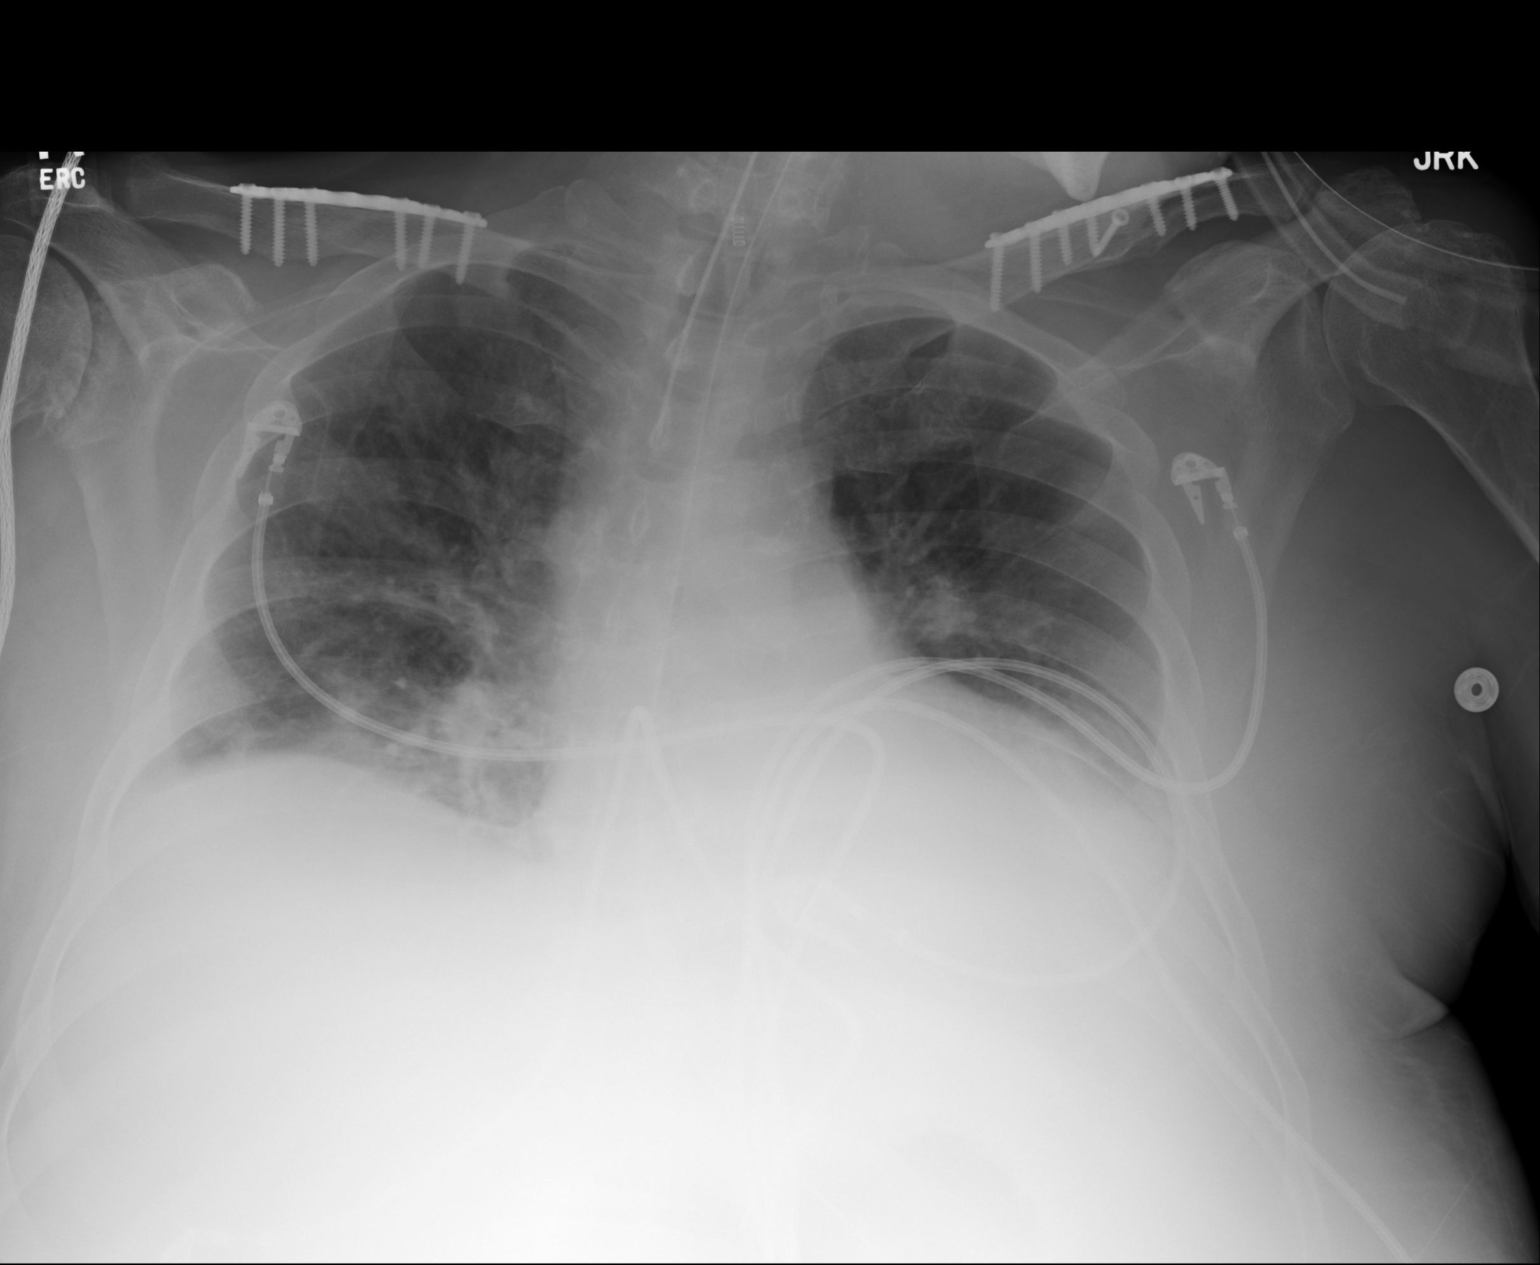

[1 of 1 positions shown; findings below may reference images not displayed]

FINDINGS: Endotracheal tube is 3.1 cm above the carina. Nasogastric tube
extends into the abdomen. There are bibasilar densities which
probably represent a combination of atelectasis and pleural fluid.
Low lung volumes and difficult to exclude mild edema. Plate and
screw fixation in both clavicles. Degenerative changes in the right
shoulder joint. Heart size is grossly stable in size but obscured by
the left basilar densities.
IMPRESSION: Stable chest radiograph findings. Persistent bibasilar densities
probably represent a combination of atelectasis and pleural fluid.

Low lung volumes and difficult to exclude mild edema.

## 2014-12-11 NOTE — Telephone Encounter (Signed)
Error

## 2014-12-12 IMAGING — CR DG CHEST 1V PORT
1 series · 1 of 1 positions shown · non-contrast
Comparison: DG CHEST 1V PORT dated 03/25/2013;

CLINICAL DATA: Ventilator dependent respiratory failure. Follow-up
basilar atelectasis versus pneumonia.

EXAM:
PORTABLE CHEST - 1 VIEW

[ap]
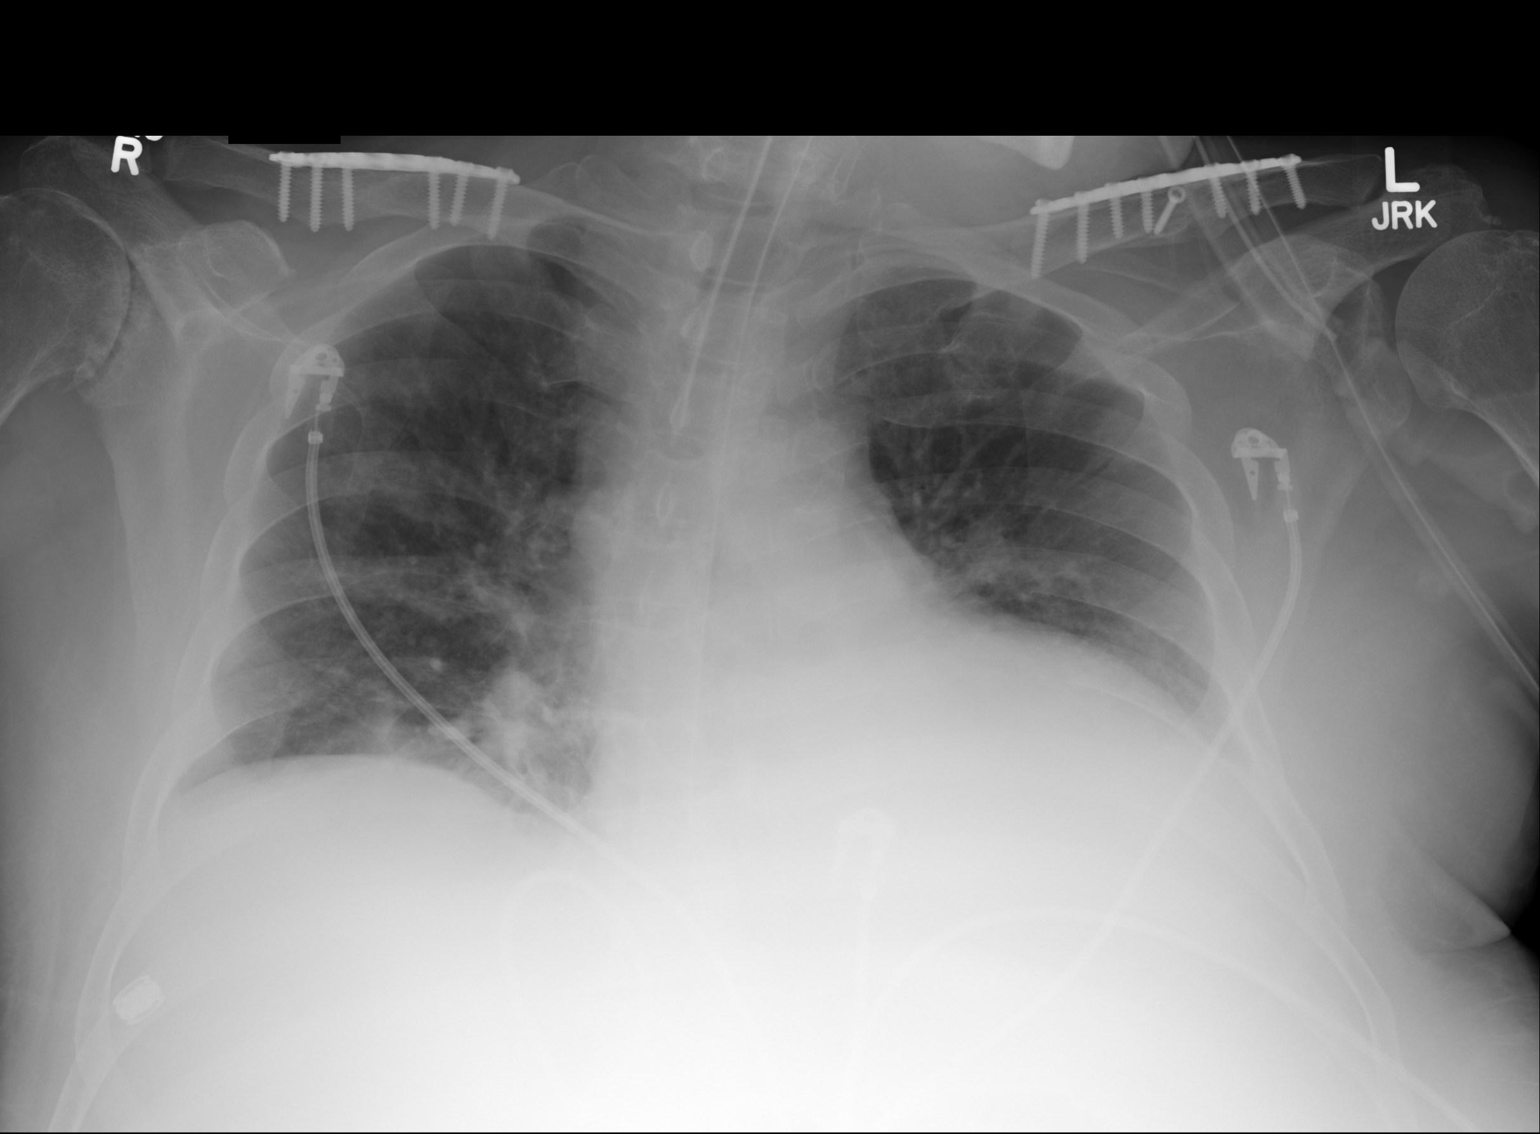

[1 of 1 positions shown; findings below may reference images not displayed]

DG CHEST 1V PORT dated
03/24/2013; DG CHEST 1V PORT dated 03/18/2013; DG CHEST 1V PORT dated
03/17/2013; DG CHEST 1V PORT dated 03/15/2013
FINDINGS: Endotracheal tube tip in satisfactory position approximately 4 cm
above the carina. Cardiac silhouette mildly enlarged but stable.
Pulmonary vascularity normal. Improved aeration of the right lung
base since yesterday. Persistent dense consolidation in the left
lower lobe. No new pulmonary parenchymal abnormalities.
IMPRESSION: 1. Support apparatus satisfactory.
2. Improved aeration in the right lung base since yesterday.
Persistent dense left lower lobe atelectasis versus pneumonia.
3. No new abnormalities.
